# Patient Record
Sex: Male | Born: 1985 | Smoking: Never smoker
Health system: Southern US, Community
[De-identification: ages and names within clinical notes are randomized; demographics above are authoritative.]

---

## 2020-06-24 ENCOUNTER — Other Ambulatory Visit: Payer: Self-pay

## 2020-09-18 ENCOUNTER — Emergency Department (HOSPITAL_COMMUNITY): Admission: EM | Admit: 2020-09-18 | Discharge: 2020-09-19 | Discharge status: Against Medical Advice | Payer: Self-pay

## 2021-01-18 ENCOUNTER — Emergency Department (HOSPITAL_BASED_OUTPATIENT_CLINIC_OR_DEPARTMENT_OTHER): Payer: Self-pay

## 2021-01-18 ENCOUNTER — Encounter (HOSPITAL_BASED_OUTPATIENT_CLINIC_OR_DEPARTMENT_OTHER): Payer: Self-pay | Admitting: *Deleted

## 2021-01-18 ENCOUNTER — Other Ambulatory Visit: Payer: Self-pay

## 2021-01-18 ENCOUNTER — Emergency Department (HOSPITAL_BASED_OUTPATIENT_CLINIC_OR_DEPARTMENT_OTHER)
Admission: EM | Admit: 2021-01-18 | Discharge: 2021-01-18 | Disposition: A | Discharge status: Home / Self Care | Payer: Self-pay | Attending: Emergency Medicine | Admitting: Emergency Medicine

## 2021-01-18 DIAGNOSIS — X501XXA Overexertion from prolonged static or awkward postures, initial encounter: Secondary | ICD-10-CM | POA: Insufficient documentation

## 2021-01-18 DIAGNOSIS — S93401A Sprain of unspecified ligament of right ankle, initial encounter: Secondary | ICD-10-CM | POA: Insufficient documentation

## 2021-01-18 DIAGNOSIS — Y92830 Public park as the place of occurrence of the external cause: Secondary | ICD-10-CM | POA: Insufficient documentation

## 2021-01-18 NOTE — Discharge Instructions (Signed)

## 2021-01-18 NOTE — ED Triage Notes (Signed)
Pt reports he twisted right ankle/leg at trampoline park today. Reports he heard a "crackle-pop". States unable to ambulate due to pain

## 2021-01-18 NOTE — ED Provider Notes (Signed)
[facility] EMERGENCY DEPARTMENT Provider Note   CSN: [id number] Arrival date & time: 01/18/21  2033     History Chief Complaint  Patient presents with  . Leg Injury    Kevin Nolan is a 35 y.o. male.  HPI   35 year old male presents to the emergency department today complaining of right ankle pain.  He was at a trampoline park earlier today and twisted his ankle.  At first he was able to walk on it however throughout the day it has gotten worse.  He denies any other injuries.  Pain is constant and severe in nature and it is worse with weightbearing.  History reviewed. No pertinent past medical history.  There are no problems to display for this patient.   History reviewed. No pertinent surgical history.     No family history on file.  Social History   Tobacco Use  . Smoking status: Never Smoker  . Smokeless tobacco: Never Used  Vaping Use  . Vaping Use: Some days  . Devices: hookah  Substance Use Topics  . Alcohol use: Yes  . Drug use: Never    Home Medications Prior to Admission medications   Not on File    Allergies    Patient has no known allergies.  Review of Systems   Review of Systems  Constitutional: Negative for fever.  Musculoskeletal:       Right ankle pain  Skin: Negative for wound.  Neurological: Negative for weakness and numbness.    Physical Exam Updated Vital Signs BP (!) 125/91 (BP Location: Left Arm)   Pulse 76   Temp 98.3 F (36.8 C) (Oral)   Resp 16   Ht 5\' 7"  (1.702 m)   Wt 72.6 kg   SpO2 99%   BMI 25.06 kg/m   Physical Exam Constitutional:      General: He is not in acute distress.    Appearance: He is well-developed.  Eyes:     Conjunctiva/sclera: Conjunctivae normal.  Cardiovascular:     Rate and Rhythm: Normal rate.  Pulmonary:     Effort: Pulmonary effort is normal.  Musculoskeletal:     Comments: TTP to the medial and lateral malleolus of the right ankle. No TTP to the achilles tendon or  throughout the foot. NVI distally. Swelling noted to the right ankle. No ttp to the proximal tib/fib  Skin:    General: Skin is warm and dry.  Neurological:     Mental Status: He is alert and oriented to person, place, and time.     ED Results / Procedures / Treatments   Labs (all labs ordered are listed, but only abnormal results are displayed) Labs Reviewed - No data to display  EKG None  Radiology DG Ankle Complete Right  Result Date: 01/18/2021 CLINICAL DATA:  Trampoline injury with ankle pain, initial encounter EXAM: RIGHT ANKLE - COMPLETE 3+ VIEW COMPARISON:  None. FINDINGS: There is no evidence of fracture, dislocation, or joint effusion. There is no evidence of arthropathy or other focal bone abnormality. Soft tissues are unremarkable. IMPRESSION: No acute abnormality noted. Electronically Signed   By: 01/20/2021 M.D.   On: 01/18/2021 21:18    Procedures Procedures   SPLINT APPLICATION Date/Time: 9:29 PM Authorized by: 01/20/2021 Consent: Verbal consent obtained. Risks and benefits: risks, benefits and alternatives were discussed Consent given by: patient Splint applied by: orthopedic technician Location details: rle Splint type: aso ankle Supplies used: aso ankle Post-procedure: The splinted body part was neurovascularly  unchanged following the procedure. Patient tolerance: Patient tolerated the procedure well with no immediate complications.    Medications Ordered in ED Medications - No data to display  ED Course  I have reviewed the triage vital signs and the nursing notes.  Pertinent labs & imaging results that were available during my care of the patient were reviewed by me and considered in my medical decision making (see chart for details).    MDM Rules/Calculators/A&P                          Patient presenting with ankle pain after twisting ankle prior to arrival.  Vital signs stable and patient nontoxic-appearing.  X-ray of R ankle  negative for acute fracture abnormality.  A splint was applied and crutches given.  OrthO follow-up given and patient advised to follow-up with either PCP or orthopedics in 1 week for reevaluation.  Advised Tylenol, ibuprofen, and rice protocol for pain.  Advised to return to the ER for any new or worsening symptoms in the meantime.  All questions were answered and patient understands plan and reasons to return.   Final Clinical Impression(s) / ED Diagnoses Final diagnoses:  Sprain of right ankle, unspecified ligament, initial encounter    Rx / DC Orders ED Discharge Orders    None       Karrie Meres, PA-C 01/18/21 2129    Virgina Norfolk, DO 01/18/21 2205

## 2022-07-07 IMAGING — DX DG ANKLE COMPLETE 3+V*R*
3 series · 3 of 3 positions shown · non-contrast
Comparison: None.

CLINICAL DATA: Trampoline injury with ankle pain, initial encounter

EXAM:
RIGHT ANKLE - COMPLETE 3+ VIEW

[ankle ap]
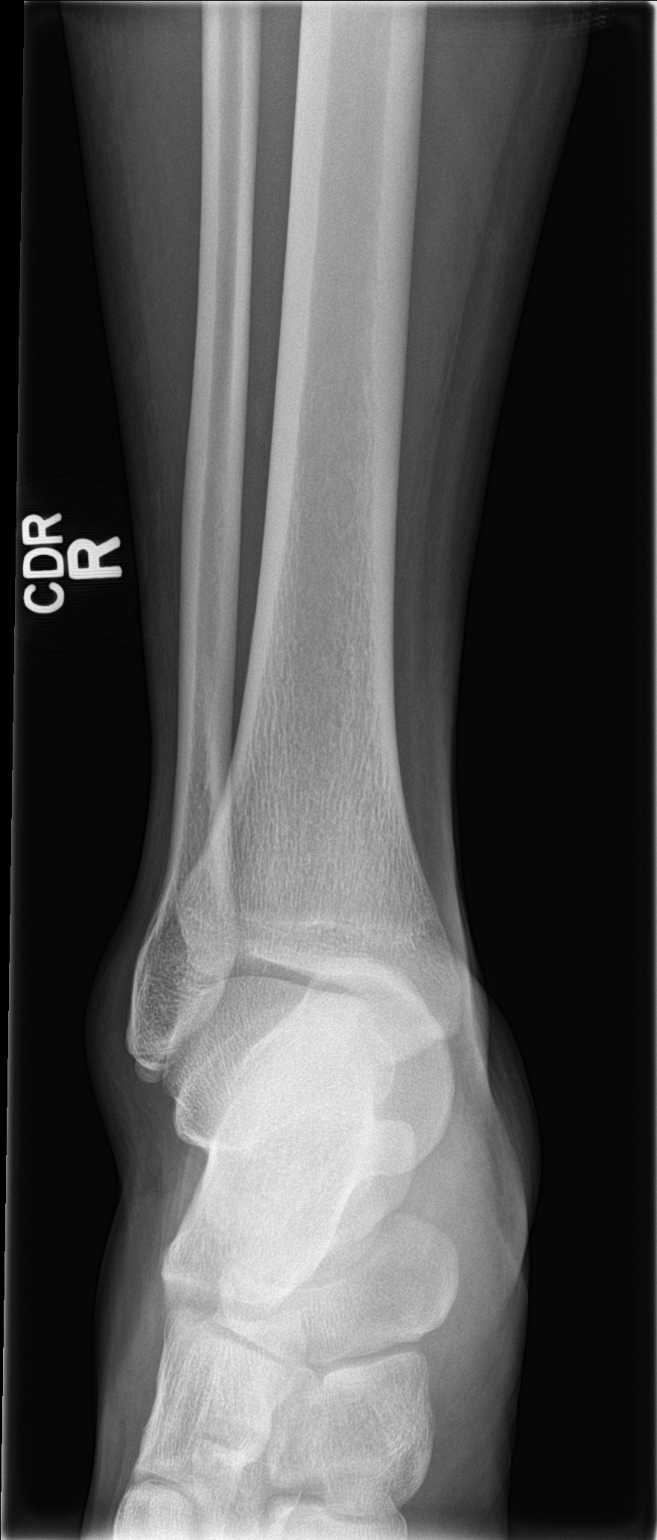

[ankle obl]
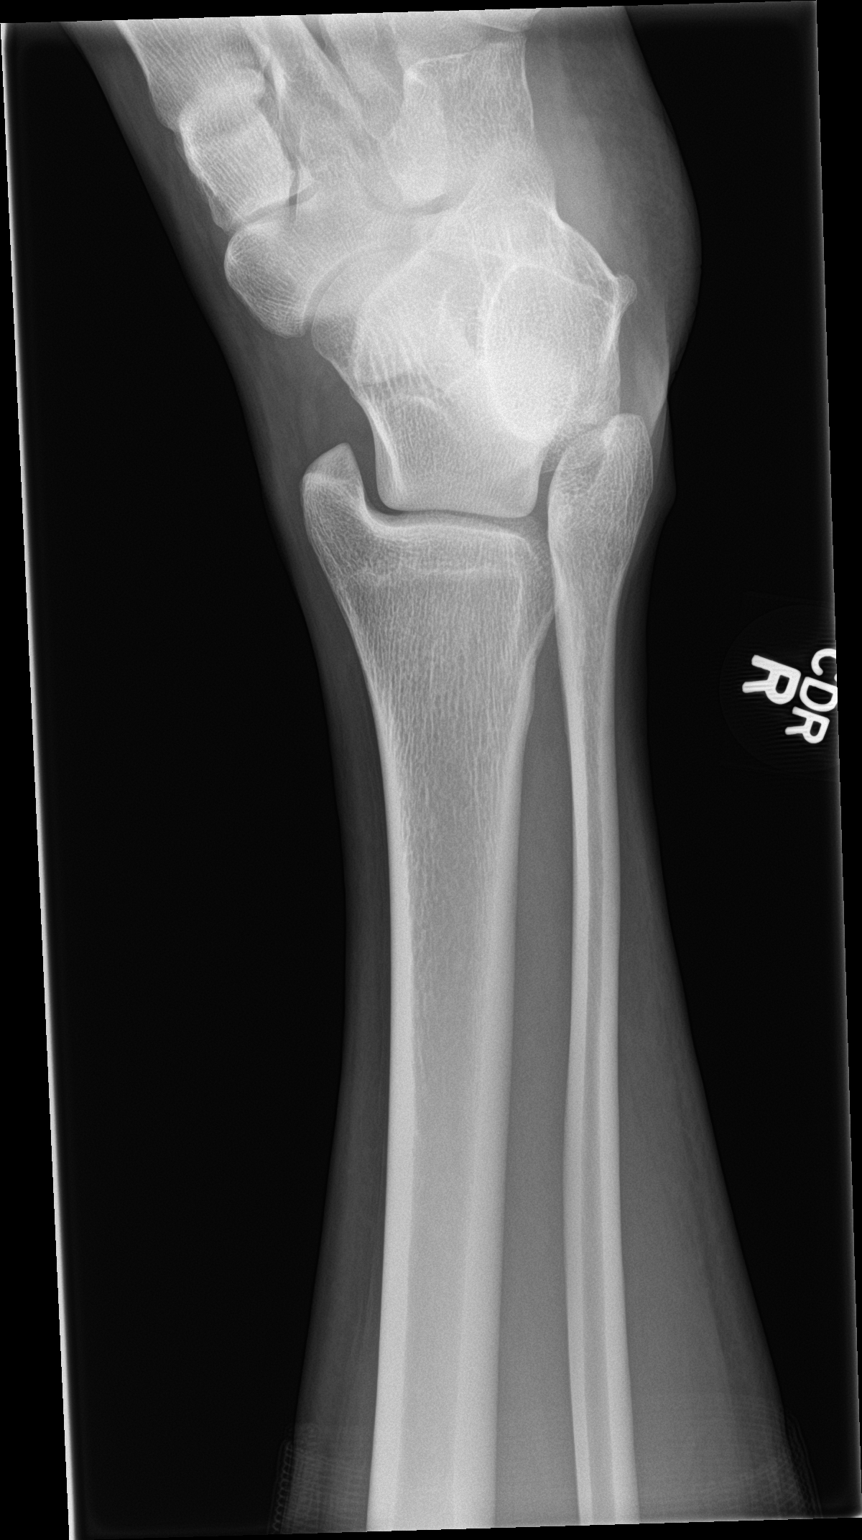

[ankle lat]
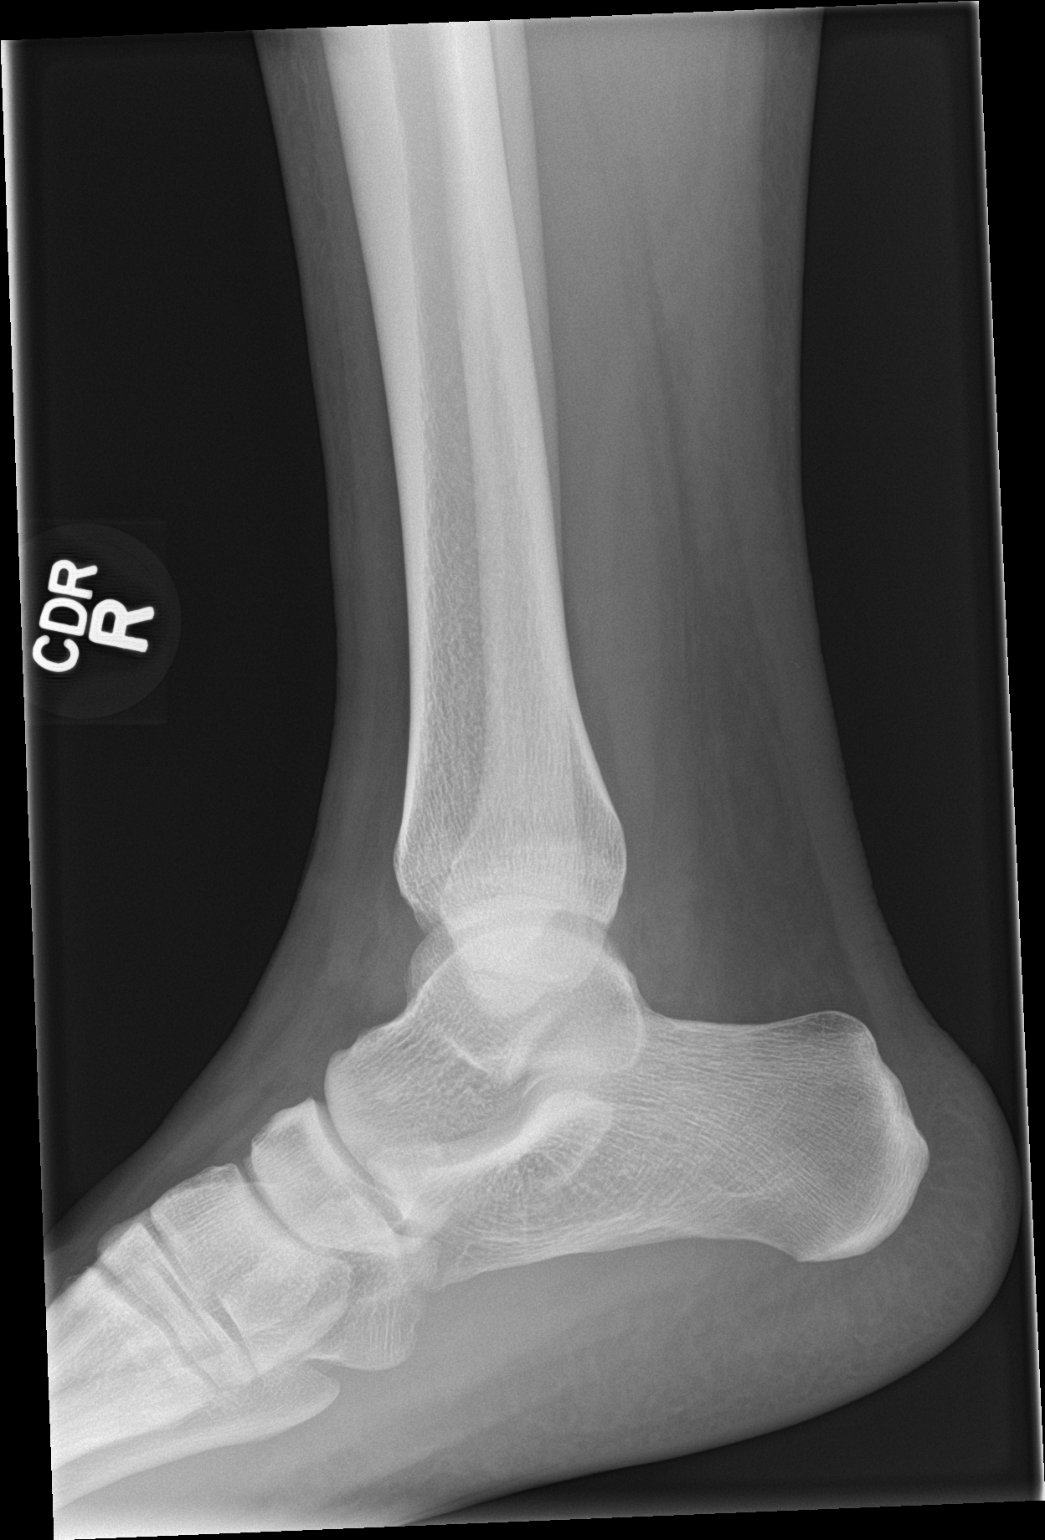

[3 of 3 positions shown; findings below may reference images not displayed]

FINDINGS: There is no evidence of fracture, dislocation, or joint effusion.
There is no evidence of arthropathy or other focal bone abnormality.
Soft tissues are unremarkable.
IMPRESSION: No acute abnormality noted.
